# Patient Record
Sex: Female | Born: 1950 | Race: White | Hispanic: No | Marital: Married | Smoking: Never smoker
Health system: Southern US, Community
[De-identification: ages and names within clinical notes are randomized; demographics above are authoritative.]

## PROBLEM LIST (undated history)

## (undated) DIAGNOSIS — R918 Other nonspecific abnormal finding of lung field: Secondary | ICD-10-CM

## (undated) DIAGNOSIS — E785 Hyperlipidemia, unspecified: Secondary | ICD-10-CM

## (undated) DIAGNOSIS — M810 Age-related osteoporosis without current pathological fracture: Secondary | ICD-10-CM

## (undated) DIAGNOSIS — D509 Iron deficiency anemia, unspecified: Secondary | ICD-10-CM

## (undated) DIAGNOSIS — H269 Unspecified cataract: Secondary | ICD-10-CM

## (undated) DIAGNOSIS — E559 Vitamin D deficiency, unspecified: Secondary | ICD-10-CM

## (undated) DIAGNOSIS — S82899A Other fracture of unspecified lower leg, initial encounter for closed fracture: Secondary | ICD-10-CM

## (undated) DIAGNOSIS — I10 Essential (primary) hypertension: Secondary | ICD-10-CM

## (undated) HISTORY — PX: BREAST SURGERY: SHX581

## (undated) HISTORY — PX: TMJ ARTHROSCOPY: SHX1067

## (undated) HISTORY — PX: BREAST EXCISIONAL BIOPSY: SUR124

## (undated) HISTORY — PX: REDUCTION MAMMAPLASTY: SUR839

## (undated) HISTORY — PX: SHOULDER SURGERY: SHX246

---

## 2015-02-24 ENCOUNTER — Encounter: Payer: Self-pay | Admitting: *Deleted

## 2015-02-24 ENCOUNTER — Ambulatory Visit
Admission: EM | Admit: 2015-02-24 | Discharge: 2015-02-24 | Disposition: A | Discharge status: Home / Self Care | Payer: BLUE CROSS/BLUE SHIELD | Attending: Family Medicine | Admitting: Family Medicine

## 2015-02-24 ENCOUNTER — Ambulatory Visit: Payer: BLUE CROSS/BLUE SHIELD

## 2015-02-24 DIAGNOSIS — S8261XA Displaced fracture of lateral malleolus of right fibula, initial encounter for closed fracture: Secondary | ICD-10-CM

## 2015-02-24 MED ORDER — KETOROLAC TROMETHAMINE 10 MG PO TABS: 10.0000 mg | ORAL_TABLET | Freq: Three times a day (TID) | ORAL | Status: DC | PRN

## 2015-02-24 NOTE — ED Notes (Signed)
Pt states that she stepped off of a trailer about 1 hour ago and heard a "crunch" when she landed.

## 2015-02-24 NOTE — ED Provider Notes (Signed)
CSN: [id number]     Arrival date & time 02/24/15  1222 History   First MD Initiated Contact with Patient 02/24/15 1324     Chief Complaint  Patient presents with  . Ankle Pain   (Consider location/radiation/quality/duration/timing/severity/associated sxs/prior Treatment) HPI Comments: 64 yo female with a c/o pain and swelling to lateral right ankle after stepping off trailer and falling.   The history is provided by the patient.    History reviewed. No pertinent past medical history. Past Surgical History  Procedure Laterality Date  . Shoulder surgery    . Tmj arthroscopy      Right side   . Breast surgery     No family history on file. Social History  Substance Use Topics  . Smoking status: Never Smoker   . Smokeless tobacco: None  . Alcohol Use: No   OB History    No data available     Review of Systems  Allergies  Erythromycin and Sulfa antibiotics  Home Medications   Prior to Admission medications   Medication Sig Start Date End Date Taking? Authorizing Provider  ketorolac (TORADOL) 10 MG tablet Take 1 tablet (10 mg total) by mouth every 8 (eight) hours as needed. 02/24/15   Payton Mccallum, MD   Meds Ordered and Administered this Visit  Medications - No data to display  BP 97/54 mmHg  Pulse 58  Temp(Src) 98.2 F (36.8 C) (Oral)  Ht  (1.6 m)  Wt 120 lb (54.432 kg)  BMI 21.26 kg/m2  SpO2 98% No data found.   Physical Exam  Constitutional: She appears well-developed and well-nourished. No distress.  Musculoskeletal:       Right ankle: She exhibits decreased range of motion and swelling. She exhibits no ecchymosis, no deformity, no laceration and normal pulse. Tenderness. Lateral malleolus and AITFL tenderness found. No head of 5th metatarsal and no proximal fibula tenderness found. Achilles tendon normal.  Skin: She is not diaphoretic.  Nursing note and vitals reviewed.   ED Course  Procedures (including critical care time)  Labs Review Labs  Reviewed - No data to display  Imaging Review Dg Ankle Complete Right  02/24/2015   CLINICAL DATA:  Injured right ankle when she stepped wrong. Lateral pain.  EXAM: RIGHT ANKLE - COMPLETE 3+ VIEW  COMPARISON:  None.  FINDINGS: There is a nondisplaced fracture of the distal tip of the lateral malleolus with mild overlying soft tissue swelling. There is no evidence of arthropathy or other focal bone abnormality. Soft tissues are unremarkable.  IMPRESSION: Nondisplaced fracture of the distal tip of the lateral malleolus with mild overlying soft tissue swelling.   Electronically Signed   By: Elige Ko   On: 02/24/2015 13:56     Visual Acuity Review  Right Eye Distance:   Left Eye Distance:   Bilateral Distance:    Right Eye Near:   Left Eye Near:    Bilateral Near:         MDM   1. Closed low lateral malleolus fracture, right, initial encounter    New Prescriptions   KETOROLAC (TORADOL) 10 MG TABLET    Take 1 tablet (10 mg total) by mouth every 8 (eight) hours as needed.  Plan: 1. x-ray results and diagnosis reviewed with patient  2. rx as per orders; risks, benefits, potential side effects reviewed with patient 3. Recommend immobilization with CAM boot and  4. F/u  within 1 week with orthopedist (patient is traveling home to [place] and will see  orthopedist there on Thursday)   Payton Mccallum, MD 02/24/15 1437

## 2015-05-18 ENCOUNTER — Other Ambulatory Visit: Payer: Self-pay | Admitting: Internal Medicine

## 2015-05-18 DIAGNOSIS — Z1231 Encounter for screening mammogram for malignant neoplasm of breast: Secondary | ICD-10-CM

## 2015-12-06 ENCOUNTER — Other Ambulatory Visit: Payer: Self-pay | Admitting: Internal Medicine

## 2015-12-06 ENCOUNTER — Ambulatory Visit
Admission: RE | Admit: 2015-12-06 | Discharge: 2015-12-06 | Disposition: A | Discharge status: Home / Self Care | Payer: BLUE CROSS/BLUE SHIELD | Source: Ambulatory Visit | Attending: Internal Medicine | Admitting: Internal Medicine

## 2015-12-06 DIAGNOSIS — Z1231 Encounter for screening mammogram for malignant neoplasm of breast: Secondary | ICD-10-CM

## 2015-12-20 ENCOUNTER — Other Ambulatory Visit: Payer: Self-pay | Admitting: Internal Medicine

## 2015-12-20 DIAGNOSIS — N644 Mastodynia: Secondary | ICD-10-CM

## 2015-12-25 ENCOUNTER — Other Ambulatory Visit: Payer: Self-pay | Admitting: *Deleted

## 2015-12-25 ENCOUNTER — Inpatient Hospital Stay
Admission: RE | Admit: 2015-12-25 | Discharge: 2015-12-25 | Disposition: A | Discharge status: Home / Self Care | Payer: Self-pay | Source: Ambulatory Visit | Attending: *Deleted | Admitting: *Deleted

## 2015-12-25 DIAGNOSIS — Z9289 Personal history of other medical treatment: Secondary | ICD-10-CM

## 2016-01-17 ENCOUNTER — Ambulatory Visit: Payer: BLUE CROSS/BLUE SHIELD

## 2016-01-17 ENCOUNTER — Other Ambulatory Visit: Payer: BLUE CROSS/BLUE SHIELD

## 2016-01-30 ENCOUNTER — Ambulatory Visit
Admission: RE | Admit: 2016-01-30 | Discharge: 2016-01-30 | Disposition: A | Discharge status: Home / Self Care | Payer: BLUE CROSS/BLUE SHIELD | Source: Ambulatory Visit | Attending: Internal Medicine | Admitting: Internal Medicine

## 2016-01-30 ENCOUNTER — Encounter (HOSPITAL_COMMUNITY): Payer: Self-pay

## 2016-01-30 DIAGNOSIS — N644 Mastodynia: Secondary | ICD-10-CM | POA: Insufficient documentation

## 2016-12-29 ENCOUNTER — Other Ambulatory Visit: Payer: Self-pay | Admitting: Specialist

## 2016-12-29 DIAGNOSIS — R1312 Dysphagia, oropharyngeal phase: Secondary | ICD-10-CM

## 2016-12-30 ENCOUNTER — Other Ambulatory Visit: Payer: Self-pay | Admitting: Internal Medicine

## 2016-12-30 DIAGNOSIS — Z1231 Encounter for screening mammogram for malignant neoplasm of breast: Secondary | ICD-10-CM

## 2017-01-02 ENCOUNTER — Other Ambulatory Visit: Payer: Self-pay | Admitting: Specialist

## 2017-01-02 DIAGNOSIS — J984 Other disorders of lung: Secondary | ICD-10-CM

## 2017-01-02 DIAGNOSIS — R918 Other nonspecific abnormal finding of lung field: Secondary | ICD-10-CM

## 2017-01-07 ENCOUNTER — Ambulatory Visit
Admission: RE | Admit: 2017-01-07 | Discharge: 2017-01-07 | Disposition: A | Discharge status: Home / Self Care | Payer: BLUE CROSS/BLUE SHIELD | Source: Ambulatory Visit | Attending: Specialist | Admitting: Specialist

## 2017-01-07 DIAGNOSIS — R1312 Dysphagia, oropharyngeal phase: Secondary | ICD-10-CM

## 2017-01-07 DIAGNOSIS — R1310 Dysphagia, unspecified: Secondary | ICD-10-CM | POA: Insufficient documentation

## 2017-01-07 NOTE — Therapy (Signed)
[place] [facility] [address] [place], [place], [address] Phone: [phone]   Fax:     Modified Barium Swallow  Patient Details  Name: Rachel Crosby MRN: [id number] Date of Birth: 09/29/1950 No Data Recorded  Encounter Date: 01/07/2017      End of Session - 01/07/17 1348    Visit Number 1   Number of Visits 1   Date for SLP Re-Evaluation 01/07/17   SLP Start Time 1230   SLP Stop Time  1315   SLP Time Calculation (min) 45 min   Activity Tolerance Patient tolerated treatment well      No past medical history on file.  Past Surgical History:  Procedure Laterality Date  . BREAST BIOPSY Right    neg  . BREAST BIOPSY Right    neg  . BREAST SURGERY    . REDUCTION MAMMAPLASTY    . SHOULDER SURGERY    . TMJ ARTHROSCOPY     Right side     There were no vitals filed for this visit.      Subjective: Patient behavior: (alertness, ability to follow instructions, etc.): The patient is alert and able to verbalize her swallowing history.  Chief complaint: chronic/recurrent cough with question of microapiration   Objective:  Radiological Procedure: A videoflouroscopic evaluation of oral-preparatory, reflex initiation, and pharyngeal phases of the swallow was performed; as well as a screening of the upper esophageal phase.  I. POSTURE: Upright in MBS chair, standing  II. VIEW: Lateral, A-P  III. COMPENSATORY STRATEGIES: Dry swallows aid pharyngeal clearance of residue  IV. BOLUSES ADMINISTERED:   Thin Liquid: 1 small, 3 rapid, consecutive   Nectar-thick Liquid: 2 moderate   Honey-thick Liquid: DNT   Puree: 3 teaspoon presentations   Mechanical Soft: 1/4 graham cracker in applesauce  V. RESULTS OF EVALUATION: A. ORAL PREPARATORY PHASE: (The lips, tongue, and velum are observed for strength and coordination)       **Overall Severity Rating: Mild; prolonged oral and piecemeal transfer; appears to be  behavioral vs. neuromotor  B. SWALLOW INITIATION/REFLEX: (The reflex is normal if "triggered" by the time the bolus reached the base of the tongue)  **Overall Severity Rating: Within normal limits  C. PHARYNGEAL PHASE: (Pharyngeal function is normal if the bolus shows rapid, smooth, and continuous transit through the pharynx and there is no pharyngeal residue after the swallow)  **Overall Severity Rating: Mild, decreased hyolaryngeal excursion, incomplete epiglottic inversion, reduced tongue base retraction, and trace-to-mild vallecular residue post swallow.    D. LARYNGEAL PENETRATION: (Material entering into the laryngeal inlet/vestibule but not aspirated) None  E. ASPIRATION: None  F. ESOPHAGEAL PHASE: (Screening of the upper esophagus): screening scan of the esophageal phase shows no over abnormality  ASSESSMENT: This 66 year old woman, with recurrent/chronic cough for 4 months, is presenting with mild oropharyngeal dysphagia characterized by slow oral transfer, decreased hyolaryngeal excursion, incomplete epiglottic inversion, reduced tongue base retraction, and trace-to-mild vallecular residue post swallow.  The residue is well contained within the valleculae and clears with dry swallows.  There is no observed laryngeal penetration or aspiration, and the patient does not appear to be at risk for prandial aspiration.  An esophageal screening with solid and liquid showed no overt abnormality.  The patient reports life-long swallowing/eating behaviors including prolonged oral management of foods and liquids.  She does not demonstrate dysarthria or difficulty with coordinating rapid alternating oral movements.  The patient reports long term esophageal symptoms and may benefit from  referral to GI.  PLAN/RECOMMENDATIONS:   A. Diet: Regular/usual diet   B. Swallowing Precautions: No special precautions indicated   C. Recommended consultation to: GI   D. Therapy recommendations: N/A   E.  Results and recommendations were discussed with the patient immediatly following the study and the final report routed to the referring MD.      Oropharyngeal dysphagia - Plan: DG Swallowing Func-Speech Pathology, DG Swallowing Func-Speech Pathology        Problem List There are no active problems to display for this patient.  Dollene PrimroseSusan G Tristain Daily, MS/CCC- SLP  Leandrew KoyanagiAbernathy, Susie 01/07/2017, 1:49 PM  [place] [facility] [address] [place], [place], [address] Phone: [phone]   Fax:     Name: Rachel Crosby MRN: [id number] Date of Birth: [birth year]

## 2017-01-13 ENCOUNTER — Ambulatory Visit
Admission: RE | Admit: 2017-01-13 | Discharge: 2017-01-13 | Disposition: A | Discharge status: Home / Self Care | Payer: BLUE CROSS/BLUE SHIELD | Source: Ambulatory Visit | Attending: Specialist | Admitting: Specialist

## 2017-01-13 DIAGNOSIS — I700 Atherosclerosis of aorta: Secondary | ICD-10-CM | POA: Diagnosis not present

## 2017-01-13 DIAGNOSIS — R918 Other nonspecific abnormal finding of lung field: Secondary | ICD-10-CM | POA: Diagnosis not present

## 2017-01-13 DIAGNOSIS — J984 Other disorders of lung: Secondary | ICD-10-CM

## 2017-02-18 ENCOUNTER — Other Ambulatory Visit: Payer: Self-pay | Admitting: Specialist

## 2017-02-18 DIAGNOSIS — R918 Other nonspecific abnormal finding of lung field: Secondary | ICD-10-CM

## 2017-02-19 ENCOUNTER — Ambulatory Visit
Admission: RE | Admit: 2017-02-19 | Discharge: 2017-02-19 | Disposition: A | Discharge status: Home / Self Care | Payer: Medicare HMO | Source: Ambulatory Visit | Attending: Internal Medicine | Admitting: Internal Medicine

## 2017-02-19 DIAGNOSIS — Z1231 Encounter for screening mammogram for malignant neoplasm of breast: Secondary | ICD-10-CM | POA: Insufficient documentation

## 2017-05-04 ENCOUNTER — Ambulatory Visit: Payer: Self-pay

## 2018-06-17 ENCOUNTER — Other Ambulatory Visit: Payer: Self-pay | Admitting: Cardiology

## 2018-06-17 ENCOUNTER — Other Ambulatory Visit: Payer: Self-pay | Admitting: Family Medicine

## 2018-06-17 DIAGNOSIS — Z1231 Encounter for screening mammogram for malignant neoplasm of breast: Secondary | ICD-10-CM

## 2018-07-13 ENCOUNTER — Ambulatory Visit
Admission: RE | Admit: 2018-07-13 | Discharge: 2018-07-13 | Disposition: A | Discharge status: Home / Self Care | Payer: Medicare HMO | Source: Ambulatory Visit | Attending: Family Medicine | Admitting: Family Medicine

## 2018-07-13 DIAGNOSIS — Z1231 Encounter for screening mammogram for malignant neoplasm of breast: Secondary | ICD-10-CM | POA: Diagnosis present

## 2019-06-28 ENCOUNTER — Other Ambulatory Visit
Admission: RE | Admit: 2019-06-28 | Discharge: 2019-06-28 | Disposition: A | Discharge status: Home / Self Care | Payer: Medicare HMO | Attending: Orthopedic Surgery | Admitting: Orthopedic Surgery

## 2019-06-28 ENCOUNTER — Other Ambulatory Visit: Payer: Self-pay

## 2019-06-28 DIAGNOSIS — M25511 Pain in right shoulder: Secondary | ICD-10-CM | POA: Diagnosis present

## 2019-06-28 LAB — CREATININE, SERUM
Creatinine, Ser: 0.64 mg/dL (ref 0.44–1.00)
GFR calc Af Amer: >60 mL/min (ref >60)
GFR calc non Af Amer: >60 mL/min (ref >60)

## 2019-06-29 ENCOUNTER — Other Ambulatory Visit: Payer: Self-pay | Admitting: Obstetrics & Gynecology

## 2019-06-29 DIAGNOSIS — Z1231 Encounter for screening mammogram for malignant neoplasm of breast: Secondary | ICD-10-CM

## 2019-07-04 ENCOUNTER — Other Ambulatory Visit: Payer: Self-pay | Admitting: Orthopedic Surgery

## 2019-07-04 DIAGNOSIS — M25511 Pain in right shoulder: Secondary | ICD-10-CM

## 2019-07-08 ENCOUNTER — Ambulatory Visit: Payer: BLUE CROSS/BLUE SHIELD

## 2019-07-14 ENCOUNTER — Ambulatory Visit: Payer: Medicare HMO | Attending: Internal Medicine

## 2019-07-14 ENCOUNTER — Other Ambulatory Visit: Payer: Medicare HMO

## 2019-07-14 ENCOUNTER — Ambulatory Visit: Payer: Medicare HMO

## 2019-07-14 DIAGNOSIS — Z23 Encounter for immunization: Secondary | ICD-10-CM | POA: Insufficient documentation

## 2019-07-14 NOTE — Progress Notes (Signed)
   Covid-19 Vaccination Clinic  Name:  Rachel Crosby    MRN: [id number] DOB: 1950-06-12  07/14/2019  Rachel Crosby was observed post Covid-19 immunization for 15 minutes without incidence. She was provided with Vaccine Information Sheet and instruction to access the V-Safe system.   Rachel Crosby was instructed to call 911 with any severe reactions post vaccine: Marland Kitchen Difficulty breathing  . Swelling of your face and throat  . A fast heartbeat  . A bad rash all over your body  . Dizziness and weakness    Immunizations Administered    Name Date Dose VIS Date Route   Pfizer COVID-19 Vaccine 07/14/2019  1:11 PM 0.3 mL 05/20/2019 Intramuscular   Manufacturer: [employer], [employer]   Lot: [id number]   NDC: [address]-8

## 2019-07-20 ENCOUNTER — Ambulatory Visit
Admission: RE | Admit: 2019-07-20 | Discharge: 2019-07-20 | Disposition: A | Discharge status: Home / Self Care | Payer: Medicare HMO | Source: Ambulatory Visit | Attending: Orthopedic Surgery | Admitting: Orthopedic Surgery

## 2019-07-20 ENCOUNTER — Other Ambulatory Visit: Payer: Self-pay

## 2019-07-20 DIAGNOSIS — M25511 Pain in right shoulder: Secondary | ICD-10-CM

## 2019-07-20 MED ORDER — GADOBUTROL 1 MMOL/ML IV SOLN
2.0000 mL | Freq: Once | INTRAVENOUS | Status: AC | PRN
  Administered 2019-07-20: 0.05 mL

## 2019-07-20 MED ORDER — SODIUM CHLORIDE (PF) 0.9 % IJ SOLN
10.0000 mL | INTRAMUSCULAR | Status: DC | PRN
  Administered 2019-07-20: 5 mL via INTRAVENOUS

## 2019-07-20 MED ORDER — LIDOCAINE HCL (PF) 1 % IJ SOLN
5.0000 mL | Freq: Once | INTRAMUSCULAR | Status: AC
  Administered 2019-07-20: 5 mL
  Filled 2019-07-20: qty 5

## 2019-07-20 MED ORDER — IOHEXOL 180 MG/ML  SOLN
20.0000 mL | Freq: Once | INTRAMUSCULAR | Status: AC | PRN
  Administered 2019-07-20: 15 mL

## 2019-07-25 ENCOUNTER — Ambulatory Visit: Payer: BLUE CROSS/BLUE SHIELD

## 2019-08-04 ENCOUNTER — Encounter: Payer: Self-pay | Admitting: Emergency Medicine

## 2019-08-04 ENCOUNTER — Emergency Department
Admission: EM | Admit: 2019-08-04 | Discharge: 2019-08-04 | Disposition: A | Discharge status: Home / Self Care | Payer: Medicare HMO | Attending: Emergency Medicine | Admitting: Emergency Medicine

## 2019-08-04 ENCOUNTER — Other Ambulatory Visit
Admission: RE | Admit: 2019-08-04 | Discharge: 2019-08-04 | Disposition: A | Discharge status: Home / Self Care | Payer: Medicare HMO | Source: Ambulatory Visit | Attending: Pediatrics | Admitting: Pediatrics

## 2019-08-04 ENCOUNTER — Other Ambulatory Visit: Payer: Self-pay

## 2019-08-04 ENCOUNTER — Emergency Department: Payer: Medicare HMO

## 2019-08-04 DIAGNOSIS — Z881 Allergy status to other antibiotic agents status: Secondary | ICD-10-CM | POA: Insufficient documentation

## 2019-08-04 DIAGNOSIS — R0602 Shortness of breath: Secondary | ICD-10-CM | POA: Insufficient documentation

## 2019-08-04 DIAGNOSIS — Z20822 Contact with and (suspected) exposure to covid-19: Secondary | ICD-10-CM | POA: Insufficient documentation

## 2019-08-04 DIAGNOSIS — J40 Bronchitis, not specified as acute or chronic: Secondary | ICD-10-CM | POA: Insufficient documentation

## 2019-08-04 DIAGNOSIS — Z882 Allergy status to sulfonamides status: Secondary | ICD-10-CM | POA: Insufficient documentation

## 2019-08-04 LAB — PROTIME-INR
INR: 1 (ref 0.8–1.2)
Prothrombin Time: 12.7 seconds (ref 11.4–15.2)

## 2019-08-04 LAB — BRAIN NATRIURETIC PEPTIDE: B Natriuretic Peptide: 64 pg/mL (ref 0.0–100.0)

## 2019-08-04 LAB — PROCALCITONIN: Procalcitonin: <0.1 ng/mL

## 2019-08-04 LAB — TROPONIN I (HIGH SENSITIVITY): Troponin I (High Sensitivity): 4 ng/L (ref <18)

## 2019-08-04 LAB — FIBRIN DERIVATIVES D-DIMER (ARMC ONLY): Fibrin derivatives D-dimer (ARMC): 837.44 ng/mL (FEU) — ABNORMAL HIGH (ref 0.00–499.00)

## 2019-08-04 LAB — APTT: aPTT: 30 seconds (ref 24–36)

## 2019-08-04 MED ORDER — IOHEXOL 350 MG/ML SOLN
75.0000 mL | Freq: Once | INTRAVENOUS | Status: AC | PRN
  Administered 2019-08-04: 20:00:00 75 mL via INTRAVENOUS

## 2019-08-04 MED ORDER — ACETAMINOPHEN 500 MG PO TABS
1000.0000 mg | ORAL_TABLET | Freq: Once | ORAL | Status: AC
  Administered 2019-08-04: 1000 mg via ORAL
  Filled 2019-08-04: qty 2

## 2019-08-04 MED ORDER — KETOROLAC TROMETHAMINE 30 MG/ML IJ SOLN
15.0000 mg | Freq: Once | INTRAMUSCULAR | Status: AC
  Administered 2019-08-04: 15 mg via INTRAVENOUS
  Filled 2019-08-04: qty 1

## 2019-08-04 NOTE — ED Triage Notes (Signed)
Pt sent over by [facility] clinic for a high d-dimer with sob.

## 2019-08-04 NOTE — Discharge Instructions (Addendum)
We are covering you for a possible pneumonia.  Your doctor today already prescribed the antibiotics and the steroids.  You should follow-up with your pulmonary doctor and let them know what is going on.  We are sending you a longer acting Covid test but I have low suspicion that this is Covid since her 2-hour test was negative.  This is just a precaution.  You should follow-up with the eye doctor tomorrow.  Return to the ER with any other concerns   IMPRESSION: 1. No pulmonary embolus. 2. Mild patchy ground-glass opacity in the anterior left lower lobe, likely infectious or inflammatory. 3. 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 Lingular nodules, increased in number from prior exam, previous 12 mm nodule now appears to represent 2 adjacent nodules, largest nodule currently 9 mm. Recommend continued CT follow-up to ensure stability.

## 2019-08-04 NOTE — ED Notes (Signed)
Ambulated pt per MD request. PT oxygen saturation remained 97%-98%.

## 2019-08-04 NOTE — ED Notes (Signed)
Pt has c/o sob. Was on melicam for her shoulder and stopped taking and it did help, but also has body aches and blisters in her mouth. Was checked out at [facility] and her only lab that was abnormal was her d-dimer so sent in for ct.

## 2019-08-04 NOTE — ED Provider Notes (Signed)
[facility] Emergency Department Provider Note  ____________________________________________   First MD Initiated Contact with Patient 08/04/19 1815     (approximate)  I have reviewed the triage vital signs and the nursing notes.   HISTORY  Chief Complaint Shortness of Breath    HPI Rachel Crosby is a 69 y.o. female with history of some shoulder discomfort which she was on naproxen for who comes in from [facility] for elevated D-dimer.  Patient presented to the clinic for multiple symptoms.  Patient for the past week has had pain over her entire body states that she feels like her body is just failing.  She states that she had shortness of breath that started 5 days ago that is constant, slightly improving, nothing makes it worse.  It has been associated with a cough.  Patient also has a new ulcer inside of her mouth.  Patient's labs they were reassuring.  Patient had an ESR that was normal, normal Covid, normal flu, UA with some blood and leukocytes but otherwise reassuring.  White count was normal with neutrophils of 70% electrolytes were reassuring.  Patient's D-dimer was elevated so she was sent over here for evaluation of PE.  Patient was already prescribed prednisone, Tessalon Perles and Augmentin for possible bronchitis.  Patient also reported some floaters in her right eye that started today.  Her vision was 20/25 over there and they had talked to the [facility] eye doctor who scheduled her an appointment for tomorrow morning.  Patient denies it being a complete loss of vision.  Patient already has baseline vision issues in her left eye.          History reviewed. No pertinent past medical history.  There are no problems to display for this patient.   Past Surgical History:  Procedure Laterality Date  . BREAST EXCISIONAL BIOPSY Right    neg  . BREAST EXCISIONAL BIOPSY Right    neg  . BREAST SURGERY    . REDUCTION MAMMAPLASTY    . SHOULDER  SURGERY    . TMJ ARTHROSCOPY     Right side     Prior to Admission medications   Medication Sig Start Date End Date Taking? Authorizing Provider  ketorolac (TORADOL) 10 MG tablet Take 1 tablet (10 mg total) by mouth every 8 (eight) hours as needed. 02/24/15   Norval Gable, MD    Allergies Erythromycin and Sulfa antibiotics  Family History  Problem Relation Age of Onset  . Breast cancer Maternal Aunt        80's  . Breast cancer Maternal Aunt        premenopausal     Social History Social History   Tobacco Use  . Smoking status: Never Smoker  . Smokeless tobacco: Never Used  Substance Use Topics  . Alcohol use: No  . Drug use: No      Review of Systems Constitutional: No fever/chills, muscle fatigue Eyes: Positive vision changes ENT: No sore throat. Cardiovascular: No chest pain Respiratory: Positive for SOB Gastrointestinal: No abdominal pain.  No nausea, no vomiting.  No diarrhea.  No constipation. Genitourinary: Negative for dysuria. Musculoskeletal: Negative for back pain. Skin: Negative for rash. Neurological: Negative for headaches, focal weakness or numbness. All other ROS negative ____________________________________________   PHYSICAL EXAM:  VITAL SIGNS: ED Triage Vitals [08/04/19 1746]  Enc Vitals Group     BP 135/71     Pulse Rate 75     Resp 18  Temp      Temp src      SpO2 98 %     Weight 124 lb (56.2 kg)     Height '5\' 2"'$  (1.575 m)     Head Circumference      Peak Flow      Pain Score 7     Pain Loc      Pain Edu?      Excl. in [place]?     Constitutional: Alert and oriented. Well appearing and in no acute distress. Eyes: Conjunctivae are normal. EOMI. peripheral vision intact in the right eye.  Vision is 20/25 although reports sometimes 1 out of string of letters is more difficult to see but then when she readjust to focus on it she can see it.  Pupil is reactive. Head: Atraumatic. Nose: No congestion/rhinnorhea. Mouth/Throat:  Mucous membranes are moist.  Does have an ulcer inside her mouth.  No abscess noted.  Tonsils normal. Neck: No stridor. Trachea Midline. FROM Cardiovascular: Normal rate, regular rhythm. Grossly normal heart sounds.  Good peripheral circulation. Respiratory: Clear lungs, no increased work of breathing Gastrointestinal: Soft and nontender. No distention. No abdominal bruits.  Musculoskeletal: No lower extremity tenderness nor edema.  No joint effusions. Neurologic:  Normal speech and language. No gross focal neurologic deficits are appreciated.  Skin:  Skin is warm, dry and intact. No rash noted. Psychiatric: Mood and affect are normal. Speech and behavior are normal. GU: Deferred   ____________________________________________   LABS (all labs ordered are listed, but only abnormal results are displayed)  Labs Reviewed  SARS CORONAVIRUS 2 (TAT 6-24 HRS)  PROTIME-INR  APTT  BRAIN NATRIURETIC PEPTIDE  PROCALCITONIN  PROCALCITONIN  TROPONIN I (HIGH SENSITIVITY)  TROPONIN I (HIGH SENSITIVITY)   ____________________________________________   ED ECG REPORT I, Vanessa [place], the attending physician, personally viewed and interpreted this ECG.  EKG is normal sinus rate of 75, no ST elevation, no T wave inversions, normal intervals ____________________________________________  RADIOLOGY   Official radiology report(s): CT Angio Chest PE W and/or Wo Contrast  Result Date: 08/04/2019 CLINICAL DATA:  Shortness of breath. Elevated D-dimer. EXAM: CT ANGIOGRAPHY CHEST WITH CONTRAST TECHNIQUE: Multidetector CT imaging of the chest was performed using the standard protocol during bolus administration of intravenous contrast. Multiplanar CT image reconstructions and MIPs were obtained to evaluate the vascular anatomy. CONTRAST:  7m OMNIPAQUE IOHEXOL 350 MG/ML SOLN COMPARISON:  Chest CT 01/13/2017, no interval exams. FINDINGS: Cardiovascular: There are no filling defects within the pulmonary  arteries to suggest pulmonary embolus. No aortic dissection or aneurysm. Upper normal heart size with mild right heart dilatation. No pericardial effusion. Mediastinum/Nodes: No enlarged mediastinal or hilar lymph nodes. No esophageal wall thickening. No visualized thyroid nodule. Lungs/Pleura: Scattered areas of bronchiectasis and tree in bud opacities again seen. Scattered calcified granulomas. Chronic volume loss and subpleural consolidation in the lingula stable from prior exam. Lingular nodules were present on prior exam, slightly increased in number compared with prior. Dominant nodule measures 10 mm, series 6, image 56. There is also a perifissural nodule in the lingula series 6, image 41, measuring 8 mm. Scattered calcifications in this region. Mild patchy ground-glass opacity in the anterior aspect of the left lower lobe series 6, image 66, not seen on prior exam. No pulmonary edema. No pleural effusion. Trachea and mainstem bronchi are patent. Upper Abdomen: Elongated left lobe of the liver extends into the left upper quadrant, cysts in the left upper quadrant appear within the lung gated hepatic  lobe, unchanged from previous. No acute upper abdominal findings. Musculoskeletal: There are no acute or suspicious osseous abnormalities. Review of the MIP images confirms the above findings. IMPRESSION: 1. No pulmonary embolus. 2. Mild patchy ground-glass opacity in the anterior left lower lobe, likely infectious or inflammatory. 3. 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 Lingular nodules, increased in number from prior exam, previous 12 mm nodule now appears to represent 2 adjacent nodules, largest nodule currently 9 mm. Recommend continued CT follow-up to ensure stability. Electronically Signed   By: Keith Rake M.D.   On: 08/04/2019 20:27     ____________________________________________   PROCEDURES  Procedure(s) performed (including Critical Care):  Procedures   ____________________________________________   INITIAL IMPRESSION / ASSESSMENT AND PLAN / ED COURSE   Mirjana Tarleton was evaluated in Emergency Department on 08/04/2019 for the symptoms described in the history of present illness. She was evaluated in the context of the global COVID-19 pandemic, which necessitated consideration that the patient might be at risk for infection with the SARS-CoV-2 virus that causes COVID-19. Institutional protocols and algorithms that pertain to the evaluation of patients at risk for COVID-19 are in a state of rapid change based on information released by regulatory bodies including the CDC and federal and state organizations. These policies and algorithms were followed during the patient's care in the ED.     Pt presents with SOB.  Patient came in with elevated D-dimer with a goal to get a CT PE PNA-will get xray to evaluation Anemia-CBC to evaluate ACS- will get trops Arrhythmia-Will get EKG and keep on monitor.  COVID-2-hour test is negative which is reassuring but will get the long hours test to confirm   Patient already had other labs done at primary doctor.  We will hold off on repeating those given the overall reassuring.  We will give some Tylenol and Toradol for the muscle aches.  For the vision changes patient already had an ESR that was normal and no temporal artery tenderness.  A little bit difficult to understand exactly what is happening to her vision since it seems to be intact but then it will occasionally 1 letter out of the string may be a little bit more difficult to see.  Patient denies any pain in her eye and no complete vision loss I have low suspicion for retinal artery occlusion or venous occlusion.  Patient already has follow-up tomorrow morning with ophthalmology.  She states that she feels comfortable  following up with them.  We discussed getting ophthalmology involved here but she states that she just wants to go home and go to bed and will follow up with them tomorrow.  He that this is reasonable given extraocular movements are intact, pupils reactive.   Patient feeling much better after the Tylenol and Toradol.  Patient CT scan shows concerning for some groundglass opacities in the anterior left lower lobe likely infectious or inflammatory.  Her procalcitonin was negative but patient was already prescribed Augmentin and prednisone and I agree with continuing this.  Patient will need to follow-up with her pulmonology doctor.  She also follow-up with the eye doctor tomorrow.  Patient's oxygen levels were stable with ambulating and she feels comfortable with being discharged home at this time   ____________________________________________   FINAL CLINICAL IMPRESSION(S) / ED DIAGNOSES   Final diagnoses:  Bronchitis     MEDICATIONS GIVEN DURING THIS VISIT:  Medications  acetaminophen (TYLENOL) tablet 1,000 mg (1,000 mg Oral Given 08/04/19 1925)  ketorolac (TORADOL) 30 MG/ML injection 15 mg (15 mg Intravenous Given 08/04/19 1923)  iohexol (OMNIPAQUE) 350 MG/ML injection 75 mL (75 mLs Intravenous Contrast Given 08/04/19 1952)     ED Discharge Orders    None       Note:  This document was prepared using Dragon voice recognition software and may include unintentional dictation errors.   Vanessa Harrison, MD 08/04/19 2152

## 2019-08-05 LAB — SARS CORONAVIRUS 2 (TAT 6-24 HRS): SARS Coronavirus 2: NEGATIVE

## 2019-08-08 ENCOUNTER — Ambulatory Visit: Payer: Medicare HMO | Attending: Internal Medicine

## 2019-08-08 DIAGNOSIS — Z23 Encounter for immunization: Secondary | ICD-10-CM | POA: Insufficient documentation

## 2019-08-08 NOTE — Progress Notes (Signed)
   Covid-19 Vaccination Clinic  Name:  Rachel Crosby    MRN: [id number] DOB: Dec 17, 1950  08/08/2019  [name] was observed post Covid-19 immunization for 15 minutes without incidence. She was provided with Vaccine Information Sheet and instruction to access the V-Safe system.   [name] was instructed to call 911 with any severe reactions post vaccine: Marland Kitchen Difficulty breathing  . Swelling of your face and throat  . A fast heartbeat  . A bad rash all over your body  . Dizziness and weakness    Immunizations Administered    Name Date Dose VIS Date Route   Pfizer COVID-19 Vaccine 08/08/2019  3:46 PM 0.3 mL 05/20/2019 Intramuscular   Manufacturer: [employer], [employer]   Lot: [id number]   NDC: [address]-8

## 2019-09-30 ENCOUNTER — Ambulatory Visit
Admission: RE | Admit: 2019-09-30 | Discharge: 2019-09-30 | Disposition: A | Discharge status: Home / Self Care | Payer: Medicare HMO | Source: Ambulatory Visit | Attending: Obstetrics & Gynecology | Admitting: Obstetrics & Gynecology

## 2019-09-30 DIAGNOSIS — Z1231 Encounter for screening mammogram for malignant neoplasm of breast: Secondary | ICD-10-CM | POA: Diagnosis present

## 2020-01-06 ENCOUNTER — Other Ambulatory Visit: Payer: Self-pay

## 2020-01-06 ENCOUNTER — Ambulatory Visit
Admission: EM | Admit: 2020-01-06 | Discharge: 2020-01-06 | Disposition: A | Discharge status: Home / Self Care | Payer: Medicare HMO | Attending: Internal Medicine | Admitting: Internal Medicine

## 2020-01-06 DIAGNOSIS — N3001 Acute cystitis with hematuria: Secondary | ICD-10-CM | POA: Insufficient documentation

## 2020-01-06 LAB — URINALYSIS, COMPLETE (UACMP) WITH MICROSCOPIC
Bilirubin Urine: NEGATIVE
Glucose, UA: NEGATIVE mg/dL
Ketones, ur: NEGATIVE mg/dL
Nitrite: NEGATIVE
Protein, ur: NEGATIVE mg/dL
Specific Gravity, Urine: 1.015 (ref 1.005–1.030)
pH: 5.5 (ref 5.0–8.0)

## 2020-01-06 MED ORDER — CEPHALEXIN 500 MG PO CAPS: 500.0000 mg | ORAL_CAPSULE | Freq: Two times a day (BID) | ORAL | 0 refills | Status: AC

## 2020-01-06 NOTE — ED Triage Notes (Signed)
Patient complains of urinary urgency and frequency with lower abdomen pressure x 3 weeks.

## 2020-01-06 NOTE — ED Provider Notes (Signed)
[facility]    CSN: [id number] Arrival date & time: 01/06/20  1517      History   Chief Complaint Chief Complaint  Patient presents with  . Urinary Urgency    HPI Rachel Crosby is a 69 y.o. female comes to the urgent care with complaints of urinary frequency, urgency and dysuria as well as lower abdominal pressure over the past 3 weeks.  Symptoms have been recurrent in nature.  She has been unable to come to the clinic area because she has been busy taking care of her father-in-law.  Patient denies any flank pain.  No nausea or vomiting.  No fever or chills.  No dizziness, near syncope or syncopal episodes.Marland Kitchen   HPI  History reviewed. No pertinent past medical history.  There are no problems to display for this patient.   Past Surgical History:  Procedure Laterality Date  . BREAST EXCISIONAL BIOPSY Right    neg  . BREAST EXCISIONAL BIOPSY Right    neg  . BREAST SURGERY    . REDUCTION MAMMAPLASTY    . SHOULDER SURGERY    . TMJ ARTHROSCOPY     Right side     OB History   No obstetric history on file.      Home Medications    Prior to Admission medications   Medication Sig Start Date End Date Taking? Authorizing Provider  cephALEXin (KEFLEX) 500 MG capsule Take 1 capsule (500 mg total) by mouth 2 (two) times daily for 5 days. 01/06/20 01/11/20  Merrilee Jansky, MD    Family History Family History  Problem Relation Age of Onset  . Breast cancer Maternal Aunt        80's  . Breast cancer Maternal Aunt        premenopausal     Social History Social History   Tobacco Use  . Smoking status: Never Smoker  . Smokeless tobacco: Never Used  Vaping Use  . Vaping Use: Never used  Substance Use Topics  . Alcohol use: No  . Drug use: No     Allergies   Erythromycin and Sulfa antibiotics   Review of Systems Review of Systems  Constitutional: Negative.   Respiratory: Negative.   Gastrointestinal: Positive for abdominal pain. Negative  for diarrhea, nausea and vomiting.  Genitourinary: Negative for dysuria, flank pain, hematuria, urgency and vaginal discharge.  Musculoskeletal: Negative.   Neurological: Negative.      Physical Exam Triage Vital Signs ED Triage Vitals  Enc Vitals Group     BP 01/06/20 1602 120/73     Pulse Rate 01/06/20 1602 71     Resp 01/06/20 1602 18     Temp 01/06/20 1602 98.1 F (36.7 C)     Temp Source 01/06/20 1602 Oral     SpO2 01/06/20 1602 100 %     Weight 01/06/20 1601 125 lb (56.7 kg)     Height 01/06/20 1601 5\' 3"  (1.6 m)     Head Circumference --      Peak Flow --      Pain Score 01/06/20 1600 3     Pain Loc --      Pain Edu? --      Excl. in GC? --    No data found.  Updated Vital Signs BP 120/73 (BP Location: Right Arm)   Pulse 71   Temp 98.1 F (36.7 C) (Oral)   Resp 18   Ht 5\' 3"  (1.6 m)   Wt 56.7 kg  SpO2 100%   BMI 22.14 kg/m   Visual Acuity Right Eye Distance:   Left Eye Distance:   Bilateral Distance:    Right Eye Near:   Left Eye Near:    Bilateral Near:     Physical Exam Vitals and nursing note reviewed.  Constitutional:      General: She is not in acute distress.    Appearance: She is not ill-appearing.  Cardiovascular:     Rate and Rhythm: Normal rate and regular rhythm.     Pulses: Normal pulses.     Heart sounds: Normal heart sounds.  Pulmonary:     Effort: Pulmonary effort is normal.     Breath sounds: Normal breath sounds.  Abdominal:     General: Bowel sounds are normal.     Palpations: Abdomen is soft.  Musculoskeletal:        General: Normal range of motion.  Skin:    Capillary Refill: Capillary refill takes less than 2 seconds.  Neurological:     General: No focal deficit present.     Mental Status: She is alert and oriented to person, place, and time.      UC Treatments / Results  Labs (all labs ordered are listed, but only abnormal results are displayed) Labs Reviewed  URINALYSIS, COMPLETE (UACMP) WITH MICROSCOPIC -  Abnormal; Notable for the following components:      Result Value   Hgb urine dipstick SMALL (*)    Leukocytes,Ua MODERATE (*)    Bacteria, UA MANY (*)    All other components within normal limits  URINE CULTURE    EKG   Radiology No results found.  Procedures Procedures (including critical care time)  Medications Ordered in UC Medications - No data to display  Initial Impression / Assessment and Plan / UC Course  I have reviewed the triage vital signs and the nursing notes.  Pertinent labs & imaging results that were available during my care of the patient were reviewed by me and considered in my medical decision making (see chart for details).     1.  Acute cystitis with hematuria: Urinalysis was significant for WBC 21-50, many bacteria Urine cultures have been sent Keflex 500 mg twice daily for 5 days Patient is advised to continue hydrating herself. Return precautions given If urine cultures require change in antibiotics-we will call patient to update antibiotics. Final Clinical Impressions(s) / UC Diagnoses   Final diagnoses:  Acute cystitis with hematuria   Discharge Instructions   None    ED Prescriptions    Medication Sig Dispense Auth. Provider   cephALEXin (KEFLEX) 500 MG capsule Take 1 capsule (500 mg total) by mouth 2 (two) times daily for 5 days. 10 capsule Aretta Stetzel, Britta Mccreedy, MD     PDMP not reviewed this encounter.   Merrilee Jansky, MD 01/06/20 [phone]

## 2020-01-09 LAB — URINE CULTURE
Culture: 60000 — AB
Special Requests: NORMAL

## 2020-01-29 ENCOUNTER — Other Ambulatory Visit: Payer: Self-pay

## 2020-01-29 ENCOUNTER — Ambulatory Visit
Admission: EM | Admit: 2020-01-29 | Discharge: 2020-01-29 | Disposition: A | Discharge status: Home / Self Care | Payer: Medicare HMO | Attending: Family Medicine | Admitting: Family Medicine

## 2020-01-29 DIAGNOSIS — Z20822 Contact with and (suspected) exposure to covid-19: Secondary | ICD-10-CM

## 2020-01-29 NOTE — ED Triage Notes (Signed)
Patient in today after having a +covid exposure last week. Patient denies any symptoms.

## 2020-01-30 LAB — SARS CORONAVIRUS 2 (TAT 6-24 HRS): SARS Coronavirus 2: NEGATIVE

## 2020-02-21 ENCOUNTER — Ambulatory Visit: Payer: Medicare HMO | Attending: Internal Medicine

## 2020-02-21 DIAGNOSIS — Z23 Encounter for immunization: Secondary | ICD-10-CM

## 2020-02-21 NOTE — Progress Notes (Signed)
   Covid-19 Vaccination Clinic  Name:  Rachel Crosby    MRN: [id number] DOB: 14-Apr-1951  02/21/2020  Rachel Crosby was observed post Covid-19 immunization for 15 minutes without incident. She was provided with Vaccine Information Sheet and instruction to access the V-Safe system.   Rachel Crosby was instructed to call 911 with any severe reactions post vaccine: Marland Kitchen Difficulty breathing  . Swelling of face and throat  . A fast heartbeat  . A bad rash all over body  . Dizziness and weakness

## 2020-07-30 IMAGING — MR MR SHOULDER*R* W/CM
6 series · 40 of 40 positions shown · IV contrast (agent unspecified)
Comparison: None.

CLINICAL DATA: Limited range of motion, right shoulder pain.

EXAM:
MR ARTHROGRAM OF THE RIGHT SHOULDER
TECHNIQUE: Multiplanar, multisequence MR imaging of the right shoulder was
performed following the administration of intra-articular contrast.
CONTRAST:  See Injection Documentation.

[Series 5: T1 fat-sat · axial · right · 4.0mm · 0.55mm/px · z∈[-47,+48]mm · 8 of 20 slices shown (1 of 3)]
[im 1/20]
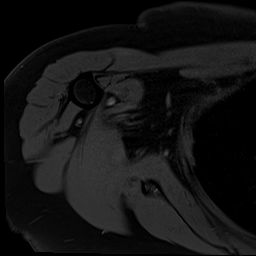
[im 3/20]
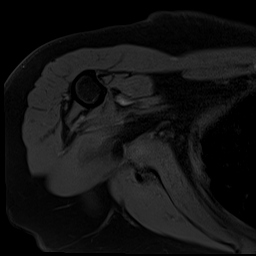
[im 6/20]
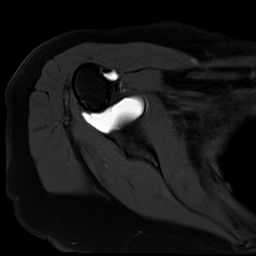
[im 9/20]
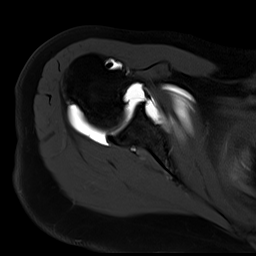
[im 11/20]
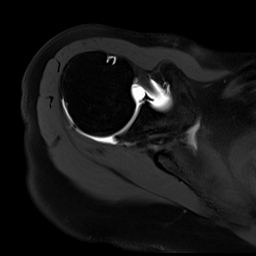
[im 14/20]
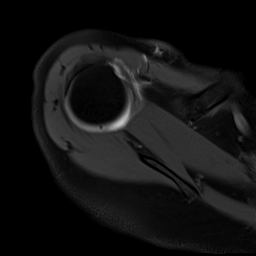
[im 17/20]
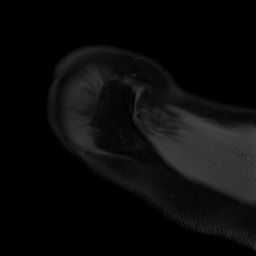
[im 20/20]
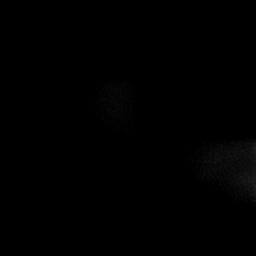

[Series 6: T1 fat-sat · oblique · right · 4.0mm · 0.55mm/px · 7 of 19 slices shown (2 of 3)]
[im 1/19]
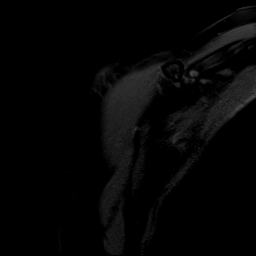
[im 4/19]
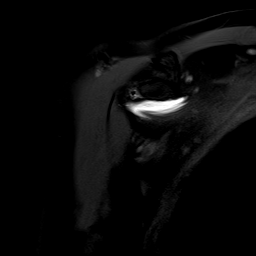
[im 7/19]
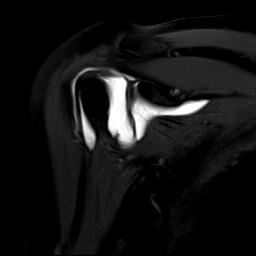
[im 10/19]
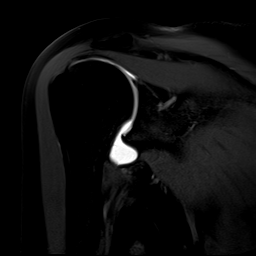
[im 13/19]
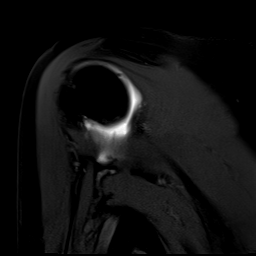
[im 16/19]
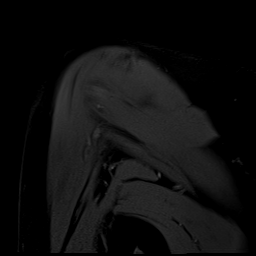
[im 19/19]
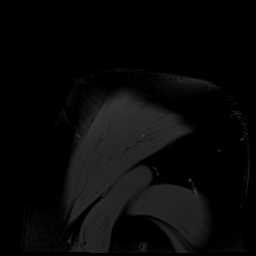

[Series 7: T2 fat-sat · oblique · right · 4.0mm · 0.55mm/px · 6 of 19 slices shown (1 of 2)]
[im 1/19]
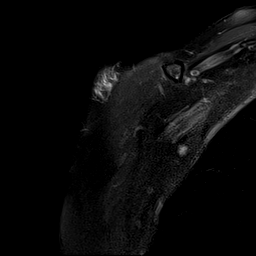
[im 4/19]
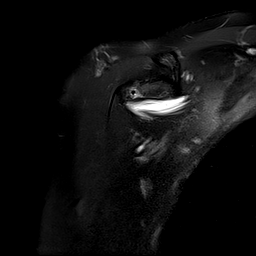
[im 8/19]
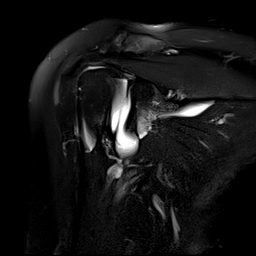
[im 11/19]
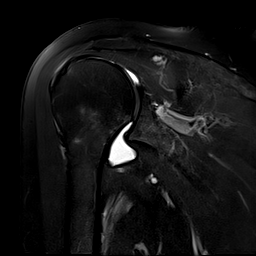
[im 15/19]
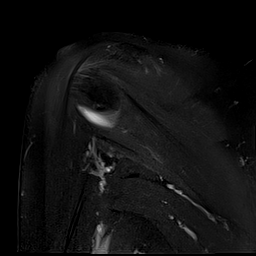
[im 19/19]
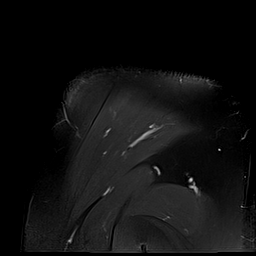

[Series 8: T1 · oblique · right · 4.0mm · 0.51mm/px · 6 of 19 slices shown]
[im 1/19]
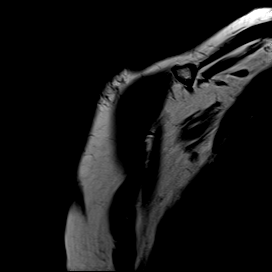
[im 4/19]
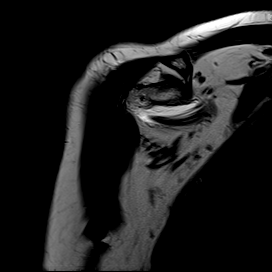
[im 8/19]
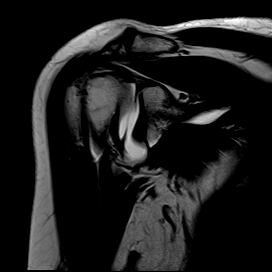
[im 11/19]
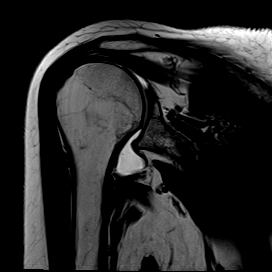
[im 15/19]
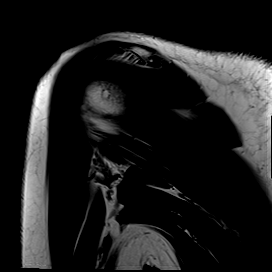
[im 19/19]
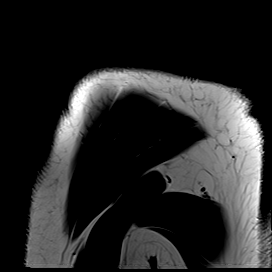

[Series 9: T2 fat-sat · oblique · right · 4.0mm · 0.55mm/px · 6 of 19 slices shown (2 of 2)]
[im 1/19]
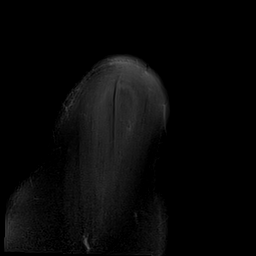
[im 4/19]
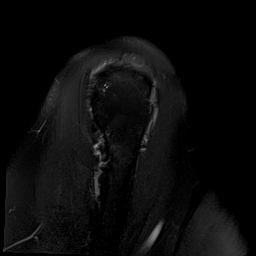
[im 8/19]
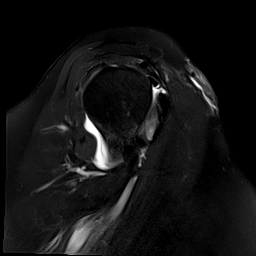
[im 11/19]
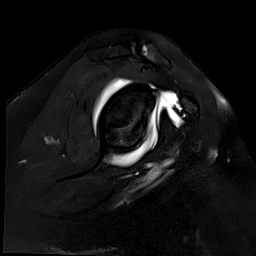
[im 15/19]
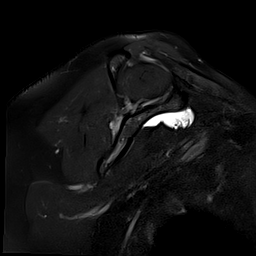
[im 19/19]
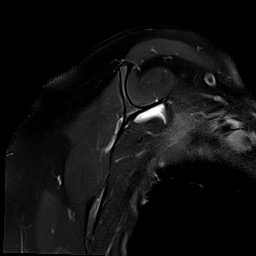

[Series 12: T1 fat-sat · sagittal · right · 4.0mm · 0.62mm/px · 7 of 21 slices shown (3 of 3)]
[im 1/21]
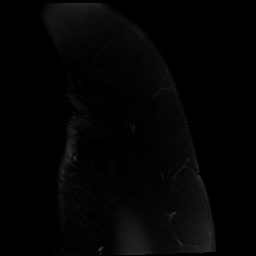
[im 4/21]
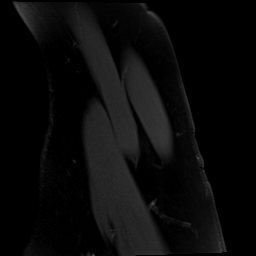
[im 7/21]
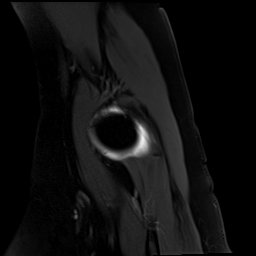
[im 11/21]
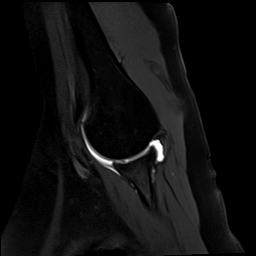
[im 14/21]
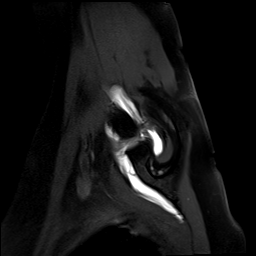
[im 17/21]
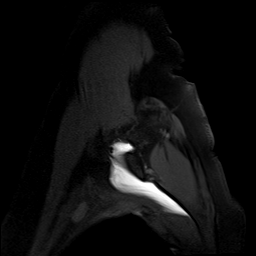
[im 21/21]
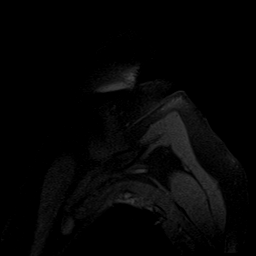

[40 of 40 positions shown; findings below may reference images not displayed]

FINDINGS: Rotator cuff: Mild tendinosis of the infraspinatus tendon.
Infraspinatus tendon is intact. Teres minor tendon is intact.
Subscapularis tendon is intact.

Muscles: No atrophy or fatty replacement of nor abnormal signal
within, the muscles of the rotator cuff.

Biceps long head: Intact.

Acromioclavicular Joint: Mild arthropathy of the acromioclavicular
joint. Type I acromion. Trace subacromial/subdeltoid bursal contrast
which may be iatrogenic from the shoulder injection versus a tiny
occult rotator cuff perforation.

Glenohumeral Joint: Intraarticular contrast distending the joint
capsule. No chondral defect. Normal glenohumeral ligaments.

Labrum: Intact.

Bones: No acute osseous abnormality.  No aggressive osseous lesion.
IMPRESSION: 1. Mild tendinosis of the infraspinatus tendon.

## 2020-07-30 IMAGING — RF DG FLUORO GUIDE NDL PLC/BX
3 series · 3 of 3 positions shown · non-contrast
Comparison: None.

INDICATION: Right shoulder pain

EXAM:
RIGHT SHOULDER ARTHROGRAM FOR SUBSEQUENT MRI
TECHNIQUE: Informed written consent was obtained from the patient after a
discussion of the risks, benefits and alternatives to treatment. The
patient was placed supine on the fluoroscopy table and the right
upper extremity was placed in a slight degree of external rotation.
The anterior aspect of the shoulder was prepped and draped in the
usual sterile fashion. A timeout was performed prior to the
initiation of the procedure.

[Series 1: fluoro_iodine 2fps_bw · 0.09mm/px · 1 of 1 slices shown (1 of 2)]
[im 1/1]
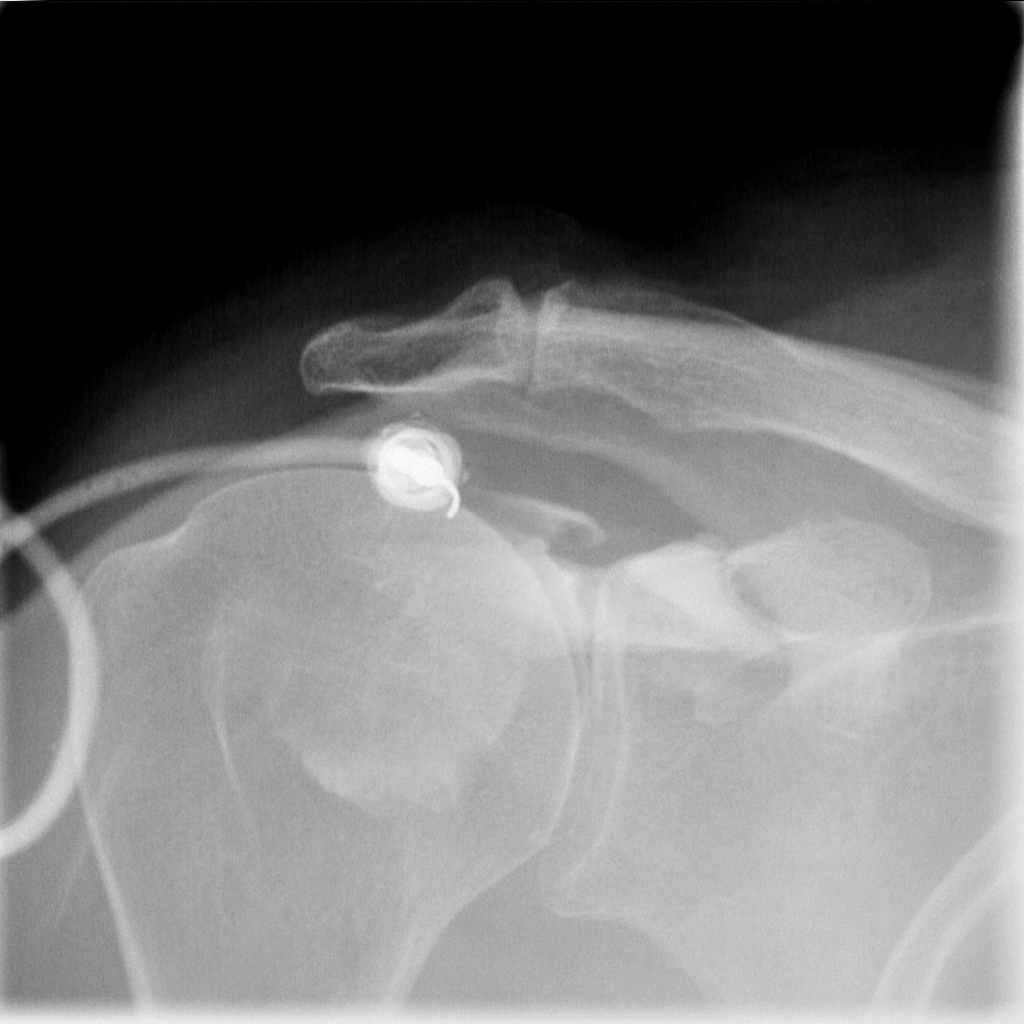

[Series 2: cp_standard · 0.09mm/px · 1 of 1 slices shown]
[im 1/1]
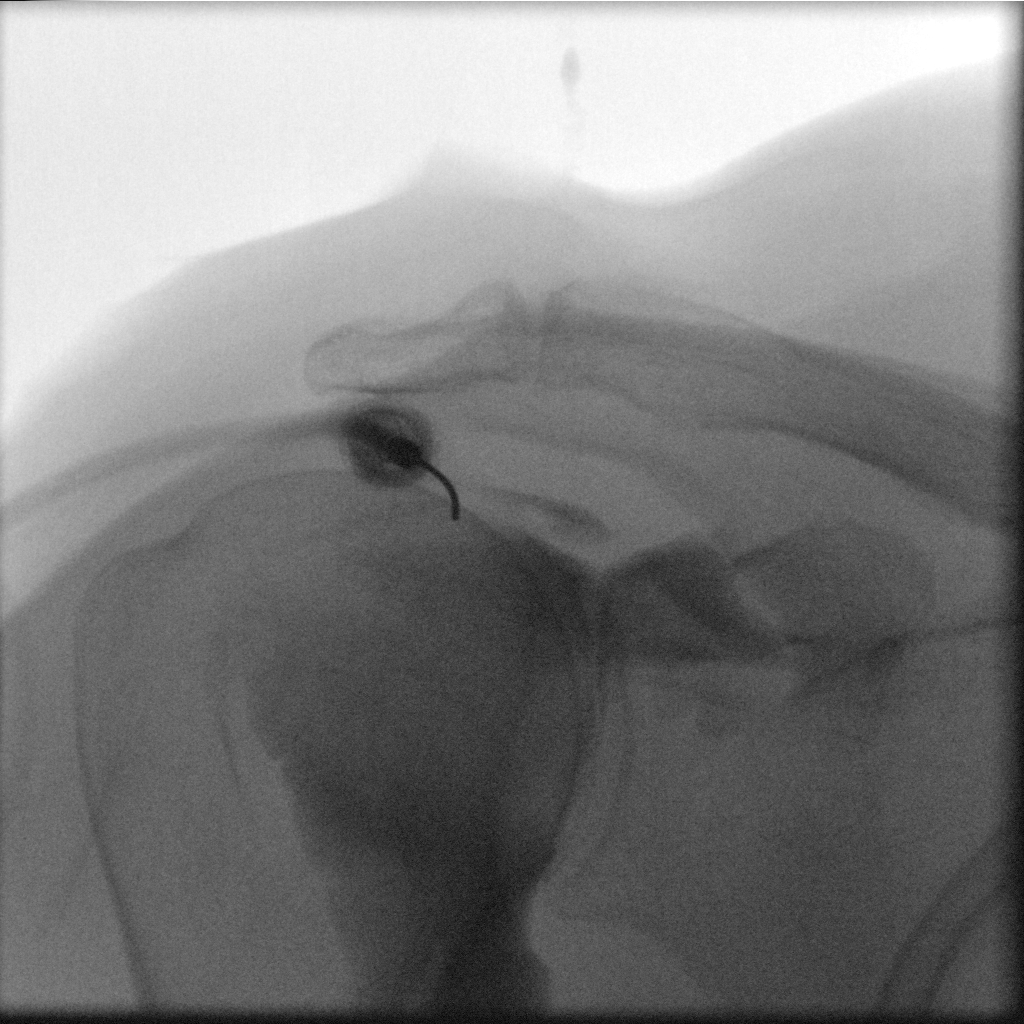

[Series 3: fluoro_iodine 2fps_bw · 0.09mm/px · 1 of 1 slices shown (2 of 2)]
[im 1/1]
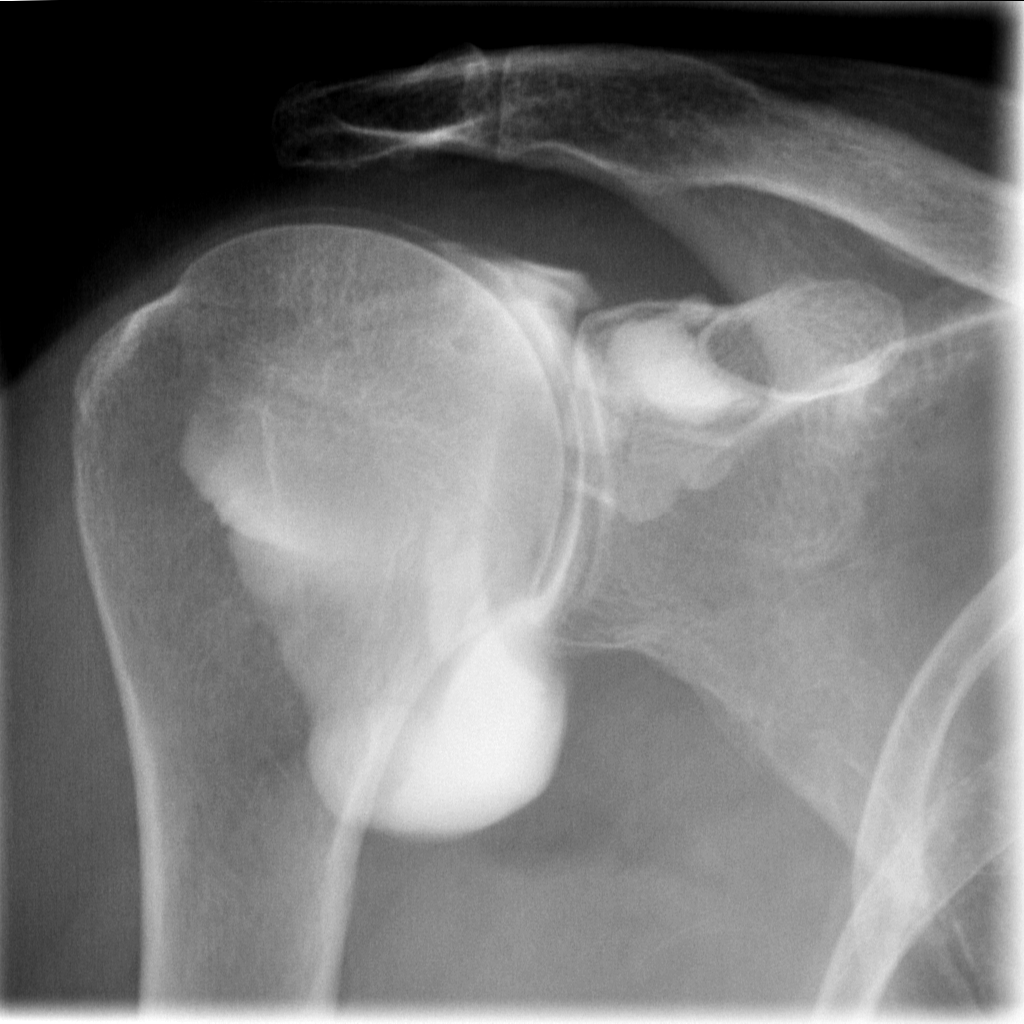

[3 of 3 positions shown; findings below may reference images not displayed]

MEDICATIONS:
None.

FLUOROSCOPY TIME:  1 minutes 12 seconds.  4.6 mGy

COMPLICATIONS:
None immediate
The superomedial aspect of the humeral head was marked
fluoroscopically and the overlying soft tissues were anesthetized
with 1% lidocaine. Under intermittent fluoroscopic guidance, a 22
gauge spinal needle was advanced into the glenohumeral joint space.
Appropriate positioning was confirmed with the injection of a small
amount of contrast. At this point a total of 13 mL of a mixture of
10 mL of Omnipaque 300, 10 mL of normal saline and 0.05 mL of
Gadavist was injected into the joint space. A fluoroscopic image was
saved and sent to PACS. At this point the procedure was terminated.
The needle was withdrawn and a dressing was placed. The patient
tolerated the procedure well without immediate post procedural
complication. The patient was escorted to MRI.
IMPRESSION: Successful fluoroscopic guided arthrogram of the right shoulder.

## 2020-08-26 LAB — EXTERNAL GENERIC LAB PROCEDURE: COLOGUARD: NEGATIVE

## 2020-08-26 LAB — COLOGUARD: COLOGUARD: NEGATIVE

## 2020-10-30 ENCOUNTER — Other Ambulatory Visit: Payer: Self-pay | Admitting: Infectious Diseases

## 2020-10-30 DIAGNOSIS — Z1231 Encounter for screening mammogram for malignant neoplasm of breast: Secondary | ICD-10-CM

## 2020-11-07 ENCOUNTER — Ambulatory Visit
Admission: RE | Admit: 2020-11-07 | Discharge: 2020-11-07 | Disposition: A | Discharge status: Home / Self Care | Payer: Medicare HMO | Source: Ambulatory Visit | Attending: Infectious Diseases | Admitting: Infectious Diseases

## 2020-11-07 ENCOUNTER — Other Ambulatory Visit: Payer: Self-pay

## 2020-11-07 DIAGNOSIS — Z1231 Encounter for screening mammogram for malignant neoplasm of breast: Secondary | ICD-10-CM

## 2021-10-23 ENCOUNTER — Other Ambulatory Visit: Payer: Self-pay | Admitting: Infectious Diseases

## 2021-10-23 ENCOUNTER — Other Ambulatory Visit: Payer: Self-pay | Admitting: Obstetrics and Gynecology

## 2021-10-23 DIAGNOSIS — Z1231 Encounter for screening mammogram for malignant neoplasm of breast: Secondary | ICD-10-CM

## 2021-11-21 ENCOUNTER — Ambulatory Visit
Admission: RE | Admit: 2021-11-21 | Discharge: 2021-11-21 | Disposition: A | Discharge status: Home / Self Care | Payer: Medicare HMO | Source: Ambulatory Visit | Attending: Obstetrics and Gynecology | Admitting: Obstetrics and Gynecology

## 2021-11-21 DIAGNOSIS — Z1231 Encounter for screening mammogram for malignant neoplasm of breast: Secondary | ICD-10-CM

## 2021-11-27 ENCOUNTER — Other Ambulatory Visit: Payer: Self-pay | Admitting: Obstetrics and Gynecology

## 2021-11-27 DIAGNOSIS — R928 Other abnormal and inconclusive findings on diagnostic imaging of breast: Secondary | ICD-10-CM

## 2021-11-27 DIAGNOSIS — N63 Unspecified lump in unspecified breast: Secondary | ICD-10-CM

## 2021-12-11 ENCOUNTER — Ambulatory Visit
Admission: RE | Admit: 2021-12-11 | Discharge: 2021-12-11 | Disposition: A | Discharge status: Home / Self Care | Payer: Medicare HMO | Source: Ambulatory Visit | Attending: Obstetrics and Gynecology | Admitting: Obstetrics and Gynecology

## 2021-12-11 DIAGNOSIS — N63 Unspecified lump in unspecified breast: Secondary | ICD-10-CM | POA: Diagnosis present

## 2021-12-11 DIAGNOSIS — R928 Other abnormal and inconclusive findings on diagnostic imaging of breast: Secondary | ICD-10-CM | POA: Insufficient documentation

## 2023-03-13 ENCOUNTER — Other Ambulatory Visit: Payer: Self-pay | Admitting: Infectious Diseases

## 2023-03-13 DIAGNOSIS — Z1231 Encounter for screening mammogram for malignant neoplasm of breast: Secondary | ICD-10-CM

## 2023-03-19 ENCOUNTER — Ambulatory Visit
Admission: RE | Admit: 2023-03-19 | Discharge: 2023-03-19 | Disposition: A | Discharge status: Home / Self Care | Payer: Medicare HMO | Source: Ambulatory Visit | Attending: Infectious Diseases | Admitting: Infectious Diseases

## 2023-03-19 DIAGNOSIS — Z1231 Encounter for screening mammogram for malignant neoplasm of breast: Secondary | ICD-10-CM | POA: Diagnosis present

## 2024-02-05 ENCOUNTER — Encounter: Payer: Self-pay | Admitting: Otolaryngology

## 2024-02-05 ENCOUNTER — Other Ambulatory Visit: Payer: Self-pay | Admitting: Otolaryngology

## 2024-02-05 DIAGNOSIS — E0789 Other specified disorders of thyroid: Secondary | ICD-10-CM

## 2024-02-10 ENCOUNTER — Ambulatory Visit
Admission: RE | Admit: 2024-02-10 | Discharge: 2024-02-10 | Disposition: A | Discharge status: Home / Self Care | Source: Ambulatory Visit | Attending: Otolaryngology | Admitting: Otolaryngology

## 2024-02-10 DIAGNOSIS — E0789 Other specified disorders of thyroid: Secondary | ICD-10-CM

## 2024-04-20 ENCOUNTER — Other Ambulatory Visit: Payer: Self-pay | Admitting: Infectious Diseases

## 2024-04-20 DIAGNOSIS — Z1231 Encounter for screening mammogram for malignant neoplasm of breast: Secondary | ICD-10-CM

## 2024-05-10 ENCOUNTER — Ambulatory Visit
Admission: RE | Admit: 2024-05-10 | Discharge: 2024-05-10 | Disposition: A | Source: Ambulatory Visit | Attending: Infectious Diseases | Admitting: Infectious Diseases

## 2024-05-10 DIAGNOSIS — Z1231 Encounter for screening mammogram for malignant neoplasm of breast: Secondary | ICD-10-CM | POA: Diagnosis present

## 2024-07-06 ENCOUNTER — Encounter: Payer: Self-pay | Admitting: Gastroenterology

## 2024-07-06 NOTE — H&P (Signed)
 "  Pre-Procedure H&P   Patient ID: Rachel Crosby is a 74 y.o. female.  Gastroenterology Provider: Elspeth Ozell Jungling, DO  Referring Provider: Romero Antigua, PA PCP: Epifanio Alm SQUIBB, MD  Date: 07/07/2024  HPI [name] is a 74 y.o. female who presents today for Esophagogastroduodenoscopy and Colonoscopy for gerd, dysphagia, colorectal cancer screening .  Patient notes some hoarseness in her voice evaluated by ENT felt secondary to reflux.  She has some solid food dysphagia but denies any issues with liquids or pills.  No odynophagia.  EGD over 15 years ago.  [place]  No other current GI symptoms   Past Medical History:  Diagnosis Date   Ankle fracture    Cataract    Hyperlipidemia    Iron deficiency anemia    Osteoporosis    Pulmonary nodules    Vitamin D deficiency     Past Surgical History:  Procedure Laterality Date   BREAST EXCISIONAL BIOPSY Right    neg   BREAST EXCISIONAL BIOPSY Right    neg   BREAST SURGERY     REDUCTION MAMMAPLASTY     SHOULDER SURGERY     TMJ ARTHROSCOPY     Right side     Family History No h/o GI disease or malignancy  Review of Systems  Constitutional:  Negative for activity change, appetite change, chills, diaphoresis, fatigue, fever and unexpected weight change.  HENT:  Positive for trouble swallowing. Negative for voice change.   Respiratory:  Negative for shortness of breath and wheezing.   Cardiovascular:  Negative for chest pain, palpitations and leg swelling.  Gastrointestinal:  Negative for abdominal distention, abdominal pain, anal bleeding, blood in stool, constipation, diarrhea, nausea, rectal pain and vomiting.  Musculoskeletal:  Negative for arthralgias and myalgias.  Skin:  Negative for color change and pallor.  Neurological:  Negative for dizziness, syncope and weakness.  Psychiatric/Behavioral:  Negative for confusion.   All other  systems reviewed and are negative.    Medications Medications Ordered Prior to Encounter[1]  Pertinent medications related to GI and procedure were reviewed by me with the patient prior to the procedure  Current Medications[2]  sodium chloride  500 mL (07/07/24 1104)       Allergies[3] Allergies were reviewed by me prior to the procedure  Objective   Body mass index is 23.91 kg/m. Vitals:   07/07/24 1100  BP: 118/63  Pulse: 69  Resp: 20  Temp: (!) 96.2 F (35.7 C)  TempSrc: Temporal  SpO2: 98%  Weight: 61.2 kg  Height: 5' 3 (1.6 m)     Physical Exam Vitals and nursing note reviewed.  Constitutional:      General: She is not in acute distress.    Appearance: Normal appearance. She is not ill-appearing, toxic-appearing or diaphoretic.  HENT:     Head: Normocephalic and atraumatic.     Nose: Nose normal.     Mouth/Throat:     Mouth: Mucous membranes are moist.     Pharynx: Oropharynx is clear.  Eyes:     General: No scleral icterus.    Extraocular Movements: Extraocular movements intact.  Cardiovascular:     Rate and Rhythm: Normal rate and regular rhythm.     Heart sounds: Normal heart sounds. No murmur heard.    No friction rub. No gallop.  Pulmonary:     Effort: Pulmonary effort is normal. No respiratory distress.     Breath sounds: Normal breath sounds.  No wheezing, rhonchi or rales.  Abdominal:     General: Abdomen is flat. Bowel sounds are normal. There is no distension.     Palpations: Abdomen is soft.     Tenderness: There is no abdominal tenderness. There is no guarding or rebound.  Musculoskeletal:     Cervical back: Neck supple.     Right lower leg: No edema.     Left lower leg: No edema.  Skin:    General: Skin is warm and dry.     Coloration: Skin is not jaundiced or pale.  Neurological:     General: No focal deficit present.     Mental Status: She is alert and oriented to person, place, and time. Mental status is at baseline.   Psychiatric:        Mood and Affect: Mood normal.        Behavior: Behavior normal.        Thought Content: Thought content normal.        Judgment: Judgment normal.      Assessment:  [name] is a 74 y.o. female  who presents today for Esophagogastroduodenoscopy and Colonoscopy for gerd, dysphagia, colorectal cancer screening .  Plan:  Esophagogastroduodenoscopy and Colonoscopy with possible intervention today  Esophagogastroduodenoscopy and Colonoscopy with possible biopsy, control of bleeding, polypectomy, and interventions as necessary has been discussed with the patient/patient representative. Informed consent was obtained from the patient/patient representative after explaining the indication, nature, and risks of the procedure including but not limited to death, bleeding, perforation, missed neoplasm/lesions, cardiorespiratory compromise, and reaction to medications. Opportunity for questions was given and appropriate answers were provided. Patient/patient representative has verbalized understanding is amenable to undergoing the procedure.   Elspeth Ozell Jungling, DO  [facility]  Portions of the record may have been created with voice recognition software. Occasional wrong-word or 'sound-a-like' substitutions may have occurred due to the inherent limitations of voice recognition software.  Read the chart carefully and recognize, using context, where substitutions may have occurred.     [1]  No current facility-administered medications on file prior to encounter.   Current Outpatient Medications on File Prior to Encounter  Medication Sig Dispense Refill   azelastine (ASTELIN) 0.1 % nasal spray Place 1 spray into both nostrils 2 (two) times daily. Use in each nostril as directed     famotidine (PEPCID) 40 MG tablet Take 40 mg by mouth at bedtime.    [2]  Current Facility-Administered Medications:    0.9 %  sodium chloride  infusion, ,  Intravenous, Continuous, Jungling Elspeth Ozell, DO, Last Rate: 20 mL/hr at 07/07/24 1104, 500 mL at 07/07/24 1104 [3]  Allergies Allergen Reactions   Doxycycline     Other reaction(s): Other (See Comments), Other (See Comments) Patient states that shortly after she took doxycycline her throat closed up.  Patient states that shortly after she took doxycycline her throat closed up.     Sulfa Antibiotics Hives and Rash    Other reaction(s): Cough   Omeprazole     Other reaction(s): Other (See Comments), Other (See Comments) Lightheadedness/ Vision and Thinking Unclear (Unable to Focus with Both) Lightheadedness/ Vision and Thinking Unclear (Unable to Focus with Both)    Propofol      Other reaction(s): Other (See Comments), Other (See Comments) Sensitive  Sensitive     Erythromycin Nausea Only   "

## 2024-07-07 ENCOUNTER — Encounter: Payer: Self-pay | Admitting: Gastroenterology

## 2024-07-07 ENCOUNTER — Encounter: Admission: RE | Disposition: A | Payer: Self-pay | Source: Home / Self Care | Attending: Gastroenterology

## 2024-07-07 ENCOUNTER — Ambulatory Visit
Admission: RE | Admit: 2024-07-07 | Discharge: 2024-07-07 | Disposition: A | Discharge status: Home / Self Care | Attending: Gastroenterology | Admitting: Gastroenterology

## 2024-07-07 DIAGNOSIS — K219 Gastro-esophageal reflux disease without esophagitis: Secondary | ICD-10-CM | POA: Insufficient documentation

## 2024-07-07 DIAGNOSIS — Z1211 Encounter for screening for malignant neoplasm of colon: Secondary | ICD-10-CM | POA: Diagnosis present

## 2024-07-07 DIAGNOSIS — R1310 Dysphagia, unspecified: Secondary | ICD-10-CM | POA: Insufficient documentation

## 2024-07-07 DIAGNOSIS — D122 Benign neoplasm of ascending colon: Secondary | ICD-10-CM | POA: Insufficient documentation

## 2024-07-07 DIAGNOSIS — D121 Benign neoplasm of appendix: Secondary | ICD-10-CM | POA: Diagnosis not present

## 2024-07-07 HISTORY — DX: Age-related osteoporosis without current pathological fracture: M81.0

## 2024-07-07 HISTORY — DX: Other nonspecific abnormal finding of lung field: R91.8

## 2024-07-07 HISTORY — DX: Iron deficiency anemia, unspecified: D50.9

## 2024-07-07 HISTORY — DX: Hyperlipidemia, unspecified: E78.5

## 2024-07-07 HISTORY — DX: Vitamin D deficiency, unspecified: E55.9

## 2024-07-07 HISTORY — DX: Essential (primary) hypertension: I10

## 2024-07-07 HISTORY — DX: Unspecified cataract: H26.9

## 2024-07-07 HISTORY — DX: Other fracture of unspecified lower leg, initial encounter for closed fracture: S82.899A

## 2024-07-07 MED ORDER — PROPOFOL 500 MG/50ML IV EMUL
INTRAVENOUS | Status: DC | PRN
  Administered 2024-07-07: 90 ug/kg/min via INTRAVENOUS

## 2024-07-07 MED ORDER — LIDOCAINE HCL (CARDIAC) PF 100 MG/5ML IV SOSY
PREFILLED_SYRINGE | INTRAVENOUS | Status: DC | PRN
  Administered 2024-07-07: 60 mg via INTRAVENOUS

## 2024-07-07 MED ORDER — GLYCOPYRROLATE 0.2 MG/ML IJ SOLN
INTRAMUSCULAR | Status: DC | PRN
  Administered 2024-07-07: .2 mg via INTRAVENOUS

## 2024-07-07 MED ORDER — PHENYLEPHRINE HCL (PRESSORS) 10 MG/ML IV SOLN
INTRAVENOUS | Status: DC | PRN
  Administered 2024-07-07: 80 ug via INTRAVENOUS

## 2024-07-07 MED ORDER — PROPOFOL 10 MG/ML IV BOLUS
INTRAVENOUS | Status: DC | PRN
  Administered 2024-07-07: 50 mg via INTRAVENOUS

## 2024-07-07 MED ORDER — SODIUM CHLORIDE 0.9 % IV SOLN
INTRAVENOUS | Status: DC
  Administered 2024-07-07: 500 mL via INTRAVENOUS

## 2024-07-07 MED ORDER — DEXMEDETOMIDINE HCL IN NACL 80 MCG/20ML IV SOLN
INTRAVENOUS | Status: DC | PRN
  Administered 2024-07-07: 8 ug via INTRAVENOUS
  Administered 2024-07-07: 12 ug via INTRAVENOUS

## 2024-07-07 NOTE — Op Note (Signed)
 [facility] Patient Name: Akirra Lacerda Procedure Date: 07/07/2024 12:33 PM MRN: [id number] Account #: [id number] Date of Birth: April 21, 1951 Admit Type: Outpatient Age: 74 Room: [facility] ENDO ROOM 1 Gender: Female Note Status: Finalized Instrument Name: Upper GI Scope [phone] Procedure:             Upper GI endoscopy Indications:           Dysphagia, GERD Providers:             Elspeth Ozell Onita ROSALEA, DO Referring MD:          Alm Needle (Referring MD) Medicines:             Monitored Anesthesia Care Complications:         No immediate complications. Estimated blood loss: None. Procedure:             Pre-Anesthesia Assessment:                        - Prior to the procedure, a History and Physical was                         performed, and patient medications and allergies were                         reviewed. The patient is competent. The risks and                         benefits of the procedure and the sedation options and                         risks were discussed with the patient. All questions                         were answered and informed consent was obtained.                         Patient identification and proposed procedure were                         verified by the physician, the nurse, the anesthetist                         and the technician in the endoscopy suite. Mental                         Status Examination: alert and oriented. Airway                         Examination: normal oropharyngeal airway and neck                         mobility. Respiratory Examination: clear to                         auscultation. CV Examination: RRR, no murmurs, no S3                         or S4. Prophylactic Antibiotics: The patient does not  require prophylactic antibiotics. Prior                         Anticoagulants: The patient has taken no anticoagulant                         or antiplatelet  agents. ASA Grade Assessment: II - A                         patient with mild systemic disease. After reviewing                         the risks and benefits, the patient was deemed in                         satisfactory condition to undergo the procedure. The                         anesthesia plan was to use monitored anesthesia care                         (MAC). Immediately prior to administration of                         medications, the patient was re-assessed for adequacy                         to receive sedatives. The heart rate, respiratory                         rate, oxygen saturations, blood pressure, adequacy of                         pulmonary ventilation, and response to care were                         monitored throughout the procedure. The physical                         status of the patient was re-assessed after the                         procedure.                        After obtaining informed consent, the endoscope was                         passed under direct vision. Throughout the procedure,                         the patient's blood pressure, pulse, and oxygen                         saturations were monitored continuously. The Endoscope                         was introduced through the mouth, and advanced to the  second part of duodenum. The upper GI endoscopy was                         accomplished without difficulty. The patient tolerated                         the procedure well. Findings:      The duodenal bulb, first portion of the duodenum and second portion of       the duodenum were normal. Estimated blood loss: none.      The gastroesophageal flap valve was visualized endoscopically and       classified as Hill Grade II (fold present, opens with respiration).       Estimated blood loss: none.      The exam of the stomach was otherwise normal.      The Z-line was regular.      Esophagogastric landmarks were  identified: the gastroesophageal junction       was found at 40 cm from the incisors.      No endoscopic abnormality was evident in the esophagus to explain the       patient's complaint of dysphagia. Estimated blood loss: none. Impression:            - Normal duodenal bulb, first portion of the duodenum                         and second portion of the duodenum.                        - Gastroesophageal flap valve classified as Hill Grade                         II (fold present, opens with respiration).                        - Z-line regular.                        - Esophagogastric landmarks identified.                        - No endoscopic esophageal abnormality to explain                         patient's dysphagia.                        - No specimens collected. Recommendation:        - Patient has a contact number available for                         emergencies. The signs and symptoms of potential                         delayed complications were discussed with the patient.                         Return to normal activities tomorrow. Written                         discharge instructions were provided  to the patient.                        - Discharge patient to home.                        - Resume previous diet.                        - Continue present medications.                        - Return to GI clinic PRN.                        - The findings and recommendations were discussed with                         the patient. Procedure Code(s):     --- Professional ---                        [phone], Esophagogastroduodenoscopy, flexible,                         transoral; diagnostic, including collection of                         specimen(s) by brushing or washing, when performed                         (separate procedure) Diagnosis Code(s):     --- Professional ---                        R13.10, Dysphagia, unspecified CPT copyright 2022 [employer].  All rights reserved. The codes documented in this report are preliminary and upon coder review may  be revised to meet current compliance requirements. Attending Participation:      I personally performed the entire procedure. Elspeth Jungling, DO Elspeth Ozell Jungling DO, DO 07/07/2024 12:48:54 PM This report has been signed electronically. Number of Addenda: 0 Note Initiated On: 07/07/2024 12:33 PM Estimated Blood Loss:  Estimated blood loss: none.      [facility]

## 2024-07-07 NOTE — Anesthesia Preprocedure Evaluation (Signed)
"                                    Anesthesia Evaluation  Patient identified by MRN, date of birth, ID band Patient awake    Reviewed: Allergy & Precautions, NPO status , Patient's Chart, lab work & pertinent test results  Airway Mallampati: III  TM Distance: <3 FB Neck ROM: full  Mouth opening: Limited Mouth Opening  Dental  (+) Teeth Intact   Pulmonary neg pulmonary ROS   Pulmonary exam normal        Cardiovascular Exercise Tolerance: Good negative cardio ROS Normal cardiovascular exam Rhythm:Regular     Neuro/Psych negative neurological ROS  negative psych ROS   GI/Hepatic negative GI ROS, Neg liver ROS,,,  Endo/Other  negative endocrine ROS    Renal/GU negative Renal ROS  negative genitourinary   Musculoskeletal   Abdominal Normal abdominal exam  (+)   Peds negative pediatric ROS (+)  Hematology negative hematology ROS (+)   Anesthesia Other Findings Past Medical History: No date: Ankle fracture No date: Cataract No date: Hyperlipidemia No date: Iron deficiency anemia No date: Osteoporosis No date: Pulmonary nodules No date: Vitamin D deficiency  Past Surgical History: No date: BREAST EXCISIONAL BIOPSY; Right     Comment:  neg No date: BREAST EXCISIONAL BIOPSY; Right     Comment:  neg No date: BREAST SURGERY No date: REDUCTION MAMMAPLASTY No date: SHOULDER SURGERY No date: TMJ ARTHROSCOPY     Comment:  Right side   BMI    Body Mass Index: 23.91 kg/m      Reproductive/Obstetrics negative OB ROS                              Anesthesia Physical Anesthesia Plan  ASA: 1  Anesthesia Plan: General   Post-op Pain Management:    Induction: Intravenous  PONV Risk Score and Plan: Propofol  infusion and TIVA  Airway Management Planned: Natural Airway and Nasal Cannula  Additional Equipment:   Intra-op Plan:   Post-operative Plan:   Informed Consent: I have reviewed the patients History and  Physical, chart, labs and discussed the procedure including the risks, benefits and alternatives for the proposed anesthesia with the patient or authorized representative who has indicated his/her understanding and acceptance.     Dental Advisory Given  Plan Discussed with: CRNA  Anesthesia Plan Comments:         Anesthesia Quick Evaluation  "

## 2024-07-07 NOTE — Op Note (Signed)
 [facility] Patient Name: Rachel Crosby Procedure Date: 07/07/2024 12:32 PM MRN: [id number] Account #: [id number] Date of Birth: 1951/01/07 Admit Type: Outpatient Age: 74 Room: [facility] ENDO ROOM 1 Gender: Female Note Status: Finalized Instrument Name: Colon Scope [id number] Procedure:             Colonoscopy Indications:           Screening for colorectal malignant neoplasm Providers:             Elspeth Ozell Onita ROSALEA, DO Referring MD:          Alm Needle (Referring MD) Medicines:             Monitored Anesthesia Care Complications:         No immediate complications. Estimated blood loss:                         Minimal. Procedure:             Pre-Anesthesia Assessment:                        - Prior to the procedure, a History and Physical was                         performed, and patient medications and allergies were                         reviewed. The patient is competent. The risks and                         benefits of the procedure and the sedation options and                         risks were discussed with the patient. All questions                         were answered and informed consent was obtained.                         Patient identification and proposed procedure were                         verified by the physician, the nurse, the anesthetist                         and the technician in the endoscopy suite. Mental                         Status Examination: alert and oriented. Airway                         Examination: normal oropharyngeal airway and neck                         mobility. Respiratory Examination: clear to                         auscultation. CV Examination: RRR, no murmurs, no S3  or S4. Prophylactic Antibiotics: The patient does not                         require prophylactic antibiotics. Prior                         Anticoagulants: The patient has taken no  anticoagulant                         or antiplatelet agents. ASA Grade Assessment: II - A                         patient with mild systemic disease. After reviewing                         the risks and benefits, the patient was deemed in                         satisfactory condition to undergo the procedure. The                         anesthesia plan was to use monitored anesthesia care                         (MAC). Immediately prior to administration of                         medications, the patient was re-assessed for adequacy                         to receive sedatives. The heart rate, respiratory                         rate, oxygen saturations, blood pressure, adequacy of                         pulmonary ventilation, and response to care were                         monitored throughout the procedure. The physical                         status of the patient was re-assessed after the                         procedure.                        After obtaining informed consent, the colonoscope was                         passed under direct vision. Throughout the procedure,                         the patient's blood pressure, pulse, and oxygen                         saturations were monitored continuously. The  Colonoscope was introduced through the anus and                         advanced to the the cecum, identified by appendiceal                         orifice and ileocecal valve. The colonoscopy was                         performed without difficulty. The patient tolerated                         the procedure well. The quality of the bowel                         preparation was evaluated using the BBPS [facility] Bowel                         Preparation Scale) with scores of: Right Colon = 3,                         Transverse Colon = 3 and Left Colon = 3 (entire mucosa                         seen well with no residual staining, small fragments                          of stool or opaque liquid). The total BBPS score                         equals 9. The ileocecal valve, appendiceal orifice,                         and rectum were photographed. Findings:      The perianal and digital rectal examinations were normal. Pertinent       negatives include normal sphincter tone.      A 2 mm polyp was found in the appendiceal orifice. The polyp was       sessile. The polyp was removed with a jumbo cold forceps. Resection and       retrieval were complete. Estimated blood loss was minimal.      A 3 to 4 mm polyp was found in the ascending colon. The polyp was       sessile. The polyp was removed with a cold snare. Resection and       retrieval were complete. Estimated blood loss was minimal.      A localized area of granular mucosa was found in the sigmoid colon.       Biopsies were taken with a cold forceps for histology. Estimated blood       loss was minimal.      The exam was otherwise without abnormality on direct and retroflexion       views. Impression:            - One 2 mm polyp at the appendiceal orifice, removed                         with a jumbo cold forceps.  Resected and retrieved.                        - One 3 to 4 mm polyp in the ascending colon, removed                         with a cold snare. Resected and retrieved.                        - Granularity in the sigmoid colon. Biopsied.                        - The examination was otherwise normal on direct and                         retroflexion views. Recommendation:        - Patient has a contact number available for                         emergencies. The signs and symptoms of potential                         delayed complications were discussed with the patient.                         Return to normal activities tomorrow. Written                         discharge instructions were provided to the patient.                        - Discharge patient to home.                         - Resume previous diet.                        - Continue present medications.                        - Await pathology results.                        - Repeat colonoscopy for surveillance based on                         pathology results.                        - Return to referring physician as previously                         scheduled.                        - The findings and recommendations were discussed with                         the patient. Procedure Code(s):     --- Professional ---  54614, Colonoscopy, flexible; with removal of                         tumor(s), polyp(s), or other lesion(s) by snare                         technique                        45380, 59, Colonoscopy, flexible; with biopsy, single                         or multiple Diagnosis Code(s):     --- Professional ---                        Z12.11, Encounter for screening for malignant neoplasm                         of colon                        D12.1, Benign neoplasm of appendix                        D12.2, Benign neoplasm of ascending colon                        K63.89, Other specified diseases of intestine CPT copyright 2022 [employer]. All rights reserved. The codes documented in this report are preliminary and upon coder review may  be revised to meet current compliance requirements. Attending Participation:      I personally performed the entire procedure. Elspeth Jungling, DO Elspeth Ozell Jungling DO, DO 07/07/2024 1:13:58 PM This report has been signed electronically. Number of Addenda: 0 Note Initiated On: 07/07/2024 12:32 PM Scope Withdrawal Time: 0 hours 12 minutes 53 seconds  Total Procedure Duration: 0 hours 17 minutes 14 seconds  Estimated Blood Loss:  Estimated blood loss was minimal.      [facility]

## 2024-07-07 NOTE — Transfer of Care (Signed)
 Immediate Anesthesia Transfer of Care Note  Patient: Rachel Crosby  Procedure(s) Performed: COLONOSCOPY EGD (ESOPHAGOGASTRODUODENOSCOPY) POLYPECTOMY, INTESTINE BIOPSY GI  Patient Location: PACU  Anesthesia Type:General  Level of Consciousness: drowsy and patient cooperative  Airway & Oxygen Therapy: Patient Spontanous Breathing  Post-op Assessment: Report given to RN, Post -op Vital signs reviewed and stable, and Patient moving all extremities X 4  Post vital signs: Reviewed and stable  Last Vitals:  Vitals Value Taken Time  BP 84/54 07/07/24 13:15  Temp 35.3 C 07/07/24 13:15  Pulse 63 07/07/24 13:15  Resp 16 07/07/24 13:15  SpO2 98 % 07/07/24 13:15    Last Pain:  Vitals:   07/07/24 1315  TempSrc: Tympanic  PainSc: Asleep         Complications: No notable events documented.

## 2024-07-07 NOTE — Interval H&P Note (Signed)
 History and Physical Interval Note: Preprocedure H&P from 07/07/24  was reviewed and there was no interval change after seeing and examining the patient.  Written consent was obtained from the patient after discussion of risks, benefits, and alternatives. Patient has consented to proceed with Esophagogastroduodenoscopy and Colonoscopy with possible intervention   07/07/2024 12:31 PM  Rachel Crosby  has presented today for surgery, with the diagnosis of Gastroesophageal reflux disease, unspecified whether esophagitis present Dysphagia, unspecified type.  The various methods of treatment have been discussed with the patient and family. After consideration of risks, benefits and other options for treatment, the patient has consented to  Procedures: COLONOSCOPY (N/A) EGD (ESOPHAGOGASTRODUODENOSCOPY) (N/A) as a surgical intervention.  The patient's history has been reviewed, patient examined, no change in status, stable for surgery.  I have reviewed the patient's chart and labs.  Questions were answered to the patient's satisfaction.     Elspeth Ozell Jungling

## 2024-07-07 NOTE — Anesthesia Postprocedure Evaluation (Signed)
"   Anesthesia Post Note  Patient: Rachel Crosby  Procedure(s) Performed: COLONOSCOPY EGD (ESOPHAGOGASTRODUODENOSCOPY) POLYPECTOMY, INTESTINE BIOPSY GI  Patient location during evaluation: PACU Anesthesia Type: General Level of consciousness: awake Pain management: satisfactory to patient Vital Signs Assessment: post-procedure vital signs reviewed and stable Respiratory status: spontaneous breathing Cardiovascular status: stable Anesthetic complications: no   No notable events documented.   Last Vitals:  Vitals:   07/07/24 1328 07/07/24 1336  BP: 99/66 (!) 101/54  Pulse: 77 72  Resp: 16 10  Temp:    SpO2: 98% 100%    Last Pain:  Vitals:   07/07/24 1336  TempSrc:   PainSc: 0-No pain                 VAN STAVEREN,Alacia Rehmann      "

## 2024-07-11 LAB — SURGICAL PATHOLOGY
